# Patient Record
Sex: Male | Born: 1995 | Race: White | Hispanic: No | Marital: Single
Health system: Southern US, Community
[De-identification: ages and names within clinical notes are randomized; demographics above are authoritative.]

## PROBLEM LIST (undated history)

## (undated) DIAGNOSIS — K5900 Constipation, unspecified: Secondary | ICD-10-CM

## (undated) DIAGNOSIS — L729 Follicular cyst of the skin and subcutaneous tissue, unspecified: Secondary | ICD-10-CM

## (undated) DIAGNOSIS — T148XXA Other injury of unspecified body region, initial encounter: Secondary | ICD-10-CM

## (undated) DIAGNOSIS — T07XXXA Unspecified multiple injuries, initial encounter: Secondary | ICD-10-CM

## (undated) HISTORY — PX: CLOSED REDUCTION WRIST FRACTURE: SHX1091

## (undated) HISTORY — PX: SPINE SURGERY: SHX786

---
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
## 2021-08-25 ENCOUNTER — Other Ambulatory Visit (HOSPITAL_COMMUNITY): Payer: Self-pay | Admitting: Urology

## 2021-08-25 ENCOUNTER — Other Ambulatory Visit: Payer: Self-pay | Admitting: Urology

## 2021-09-29 ENCOUNTER — Other Ambulatory Visit (HOSPITAL_COMMUNITY): Payer: Self-pay | Admitting: Urology

## 2021-09-29 ENCOUNTER — Other Ambulatory Visit: Payer: Self-pay | Admitting: Urology

## 2021-09-29 DIAGNOSIS — M25561 Pain in right knee: Secondary | ICD-10-CM

## 2021-09-29 DIAGNOSIS — M545 Low back pain, unspecified: Secondary | ICD-10-CM

## 2021-10-02 ENCOUNTER — Ambulatory Visit (HOSPITAL_COMMUNITY): Admit: 2021-10-02 | Payer: No Typology Code available for payment source | Source: Ambulatory Visit

## 2021-10-02 ENCOUNTER — Encounter (HOSPITAL_COMMUNITY): Payer: Self-pay

## 2021-10-02 ENCOUNTER — Ambulatory Visit (HOSPITAL_COMMUNITY): Payer: No Typology Code available for payment source

## 2021-11-07 ENCOUNTER — Ambulatory Visit (HOSPITAL_COMMUNITY)
Admission: RE | Admit: 2021-11-07 | Discharge: 2021-11-07 | Disposition: A | Discharge status: Home / Self Care | Payer: No Typology Code available for payment source | Source: Ambulatory Visit | Attending: Urology | Admitting: Urology

## 2021-11-07 DIAGNOSIS — M545 Low back pain, unspecified: Secondary | ICD-10-CM | POA: Diagnosis present

## 2021-11-07 DIAGNOSIS — M25561 Pain in right knee: Secondary | ICD-10-CM | POA: Diagnosis present

## 2023-01-23 IMAGING — MR MR LUMBAR SPINE W/O CM
4 of 5 series · 27 of 48 positions shown · non-contrast
Comparison: None.

CLINICAL DATA: Low back pain

EXAM:
MRI LUMBAR SPINE WITHOUT CONTRAST
TECHNIQUE: Multiplanar, multisequence MR imaging of the lumbar spine was
performed. No intravenous contrast was administered.

[Series 9: T2 · sagittal · 4.0mm · 0.73mm/px · 6 of 15 slices shown (1 of 2)]
[im 1/15]
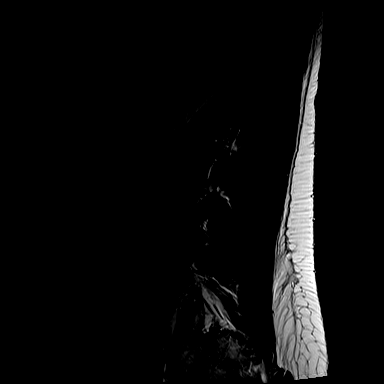
[im 3/15]
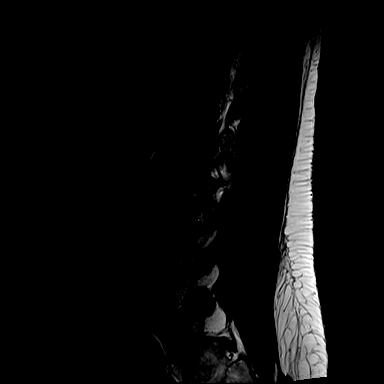
[im 6/15]
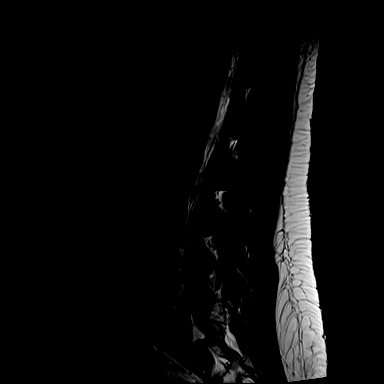
[im 9/15]
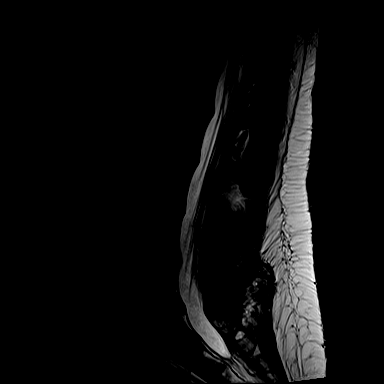
[im 12/15]
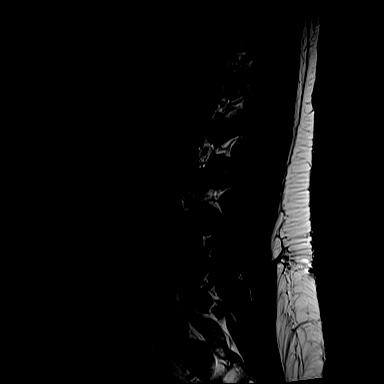
[im 15/15]
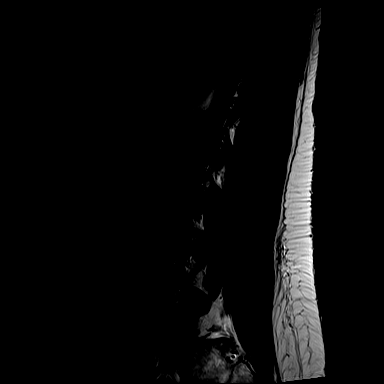

[Series 11: T1 · sagittal · 4.0mm · 0.88mm/px · 6 of 15 slices shown (1 of 2)]
[im 1/15]
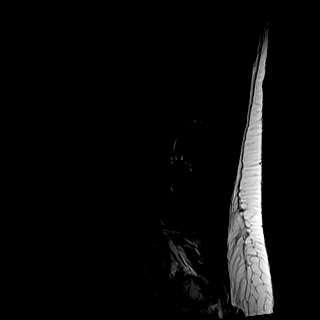
[im 3/15]
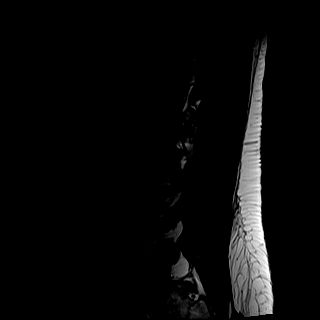
[im 6/15]
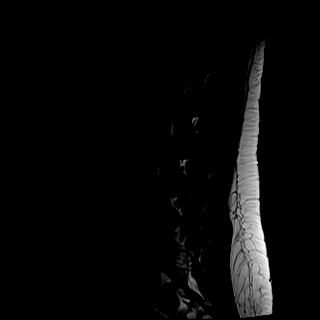
[im 9/15]
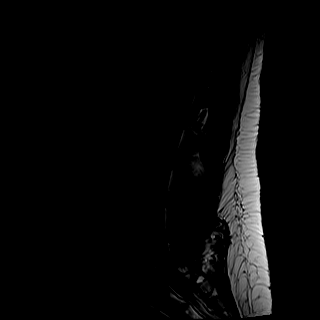
[im 12/15]
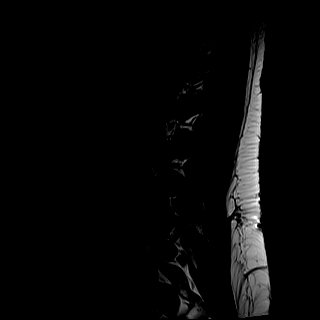
[im 15/15]
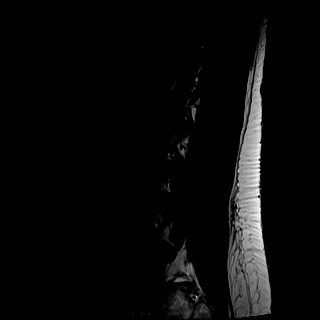

[Series 12: T2 · axial · 4.0mm · 0.57mm/px · z∈[+47,+267]mm · 9 of 37 slices shown (2 of 2)]
[im 1/37]
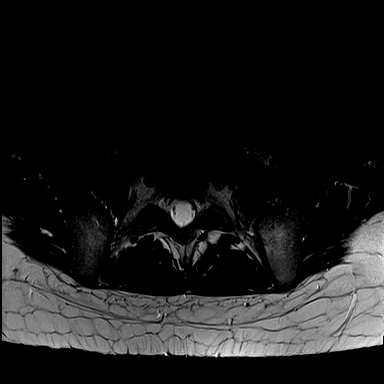
[im 6/37]
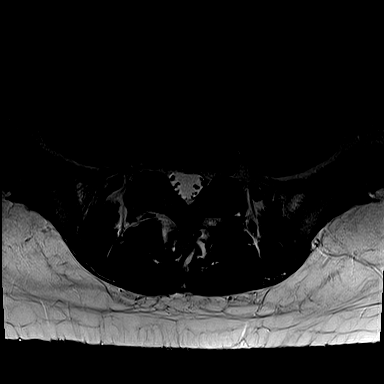
[im 11/37]
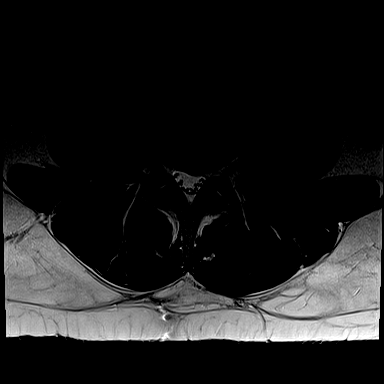
[im 16/37]
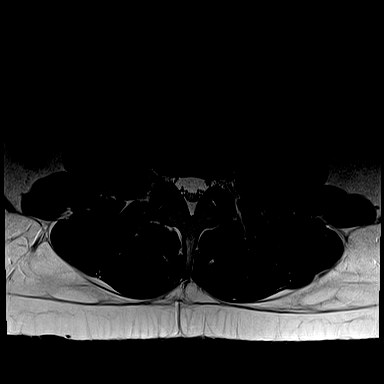
[im 19/37]
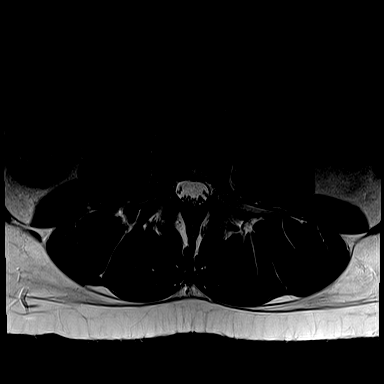
[im 21/37]
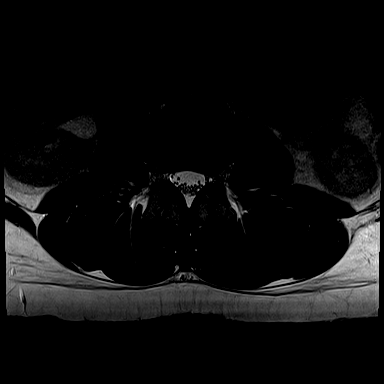
[im 26/37]
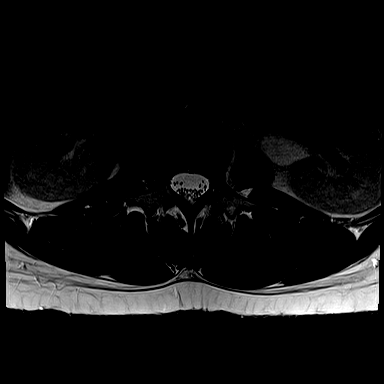
[im 31/37]
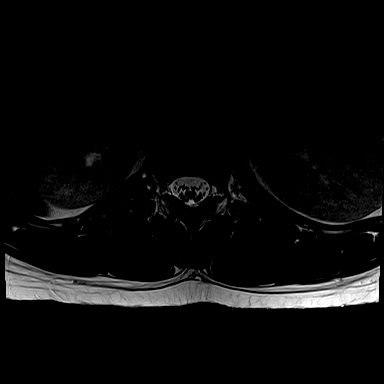
[im 37/37]
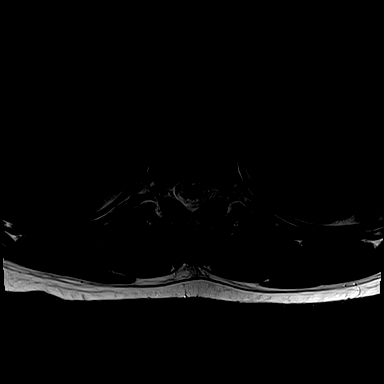

[Series 13: T1 · axial · 4.0mm · 0.34mm/px · z∈[+47,+238]mm · 6 of 37 slices shown (2 of 2)]
[im 1/37]
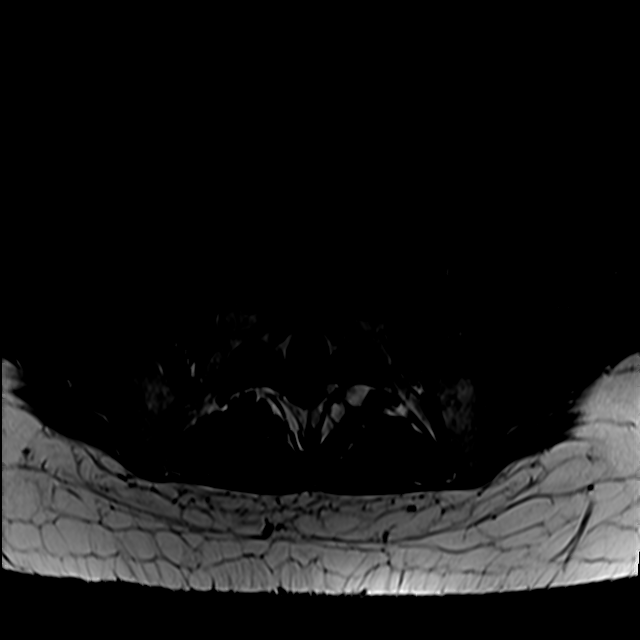
[im 6/37]
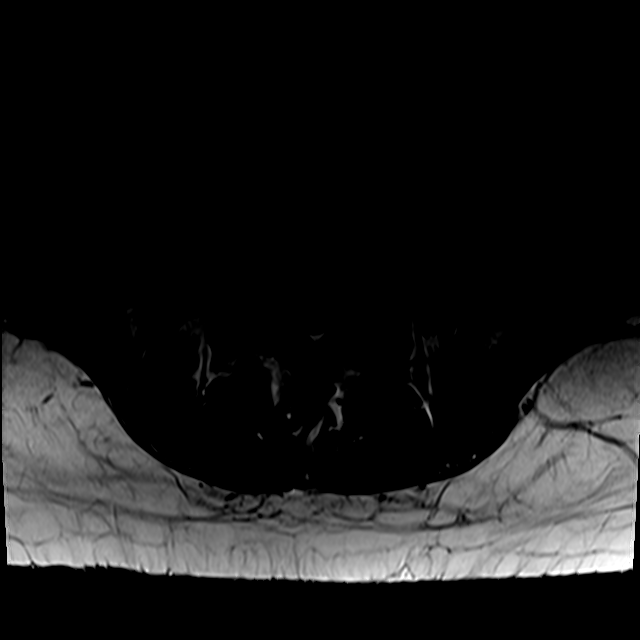
[im 11/37]
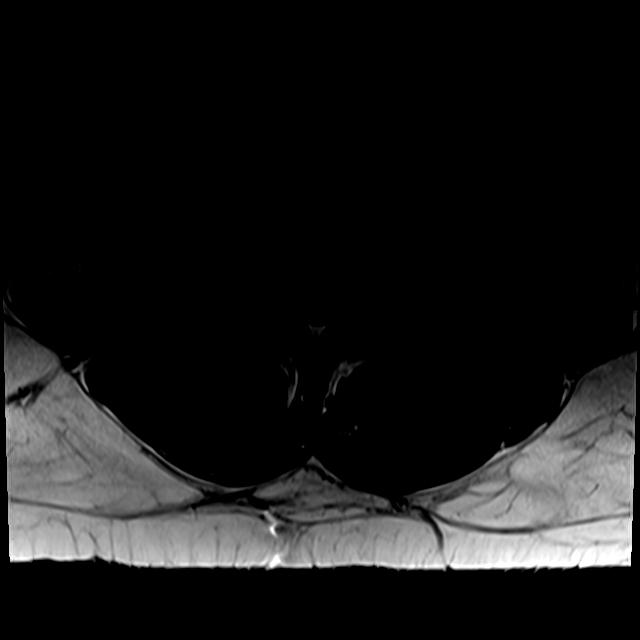
[im 16/37]
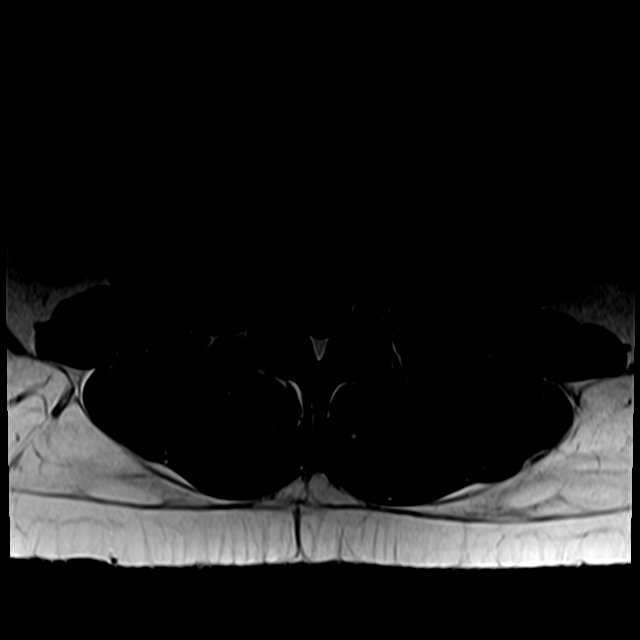
[im 19/37]
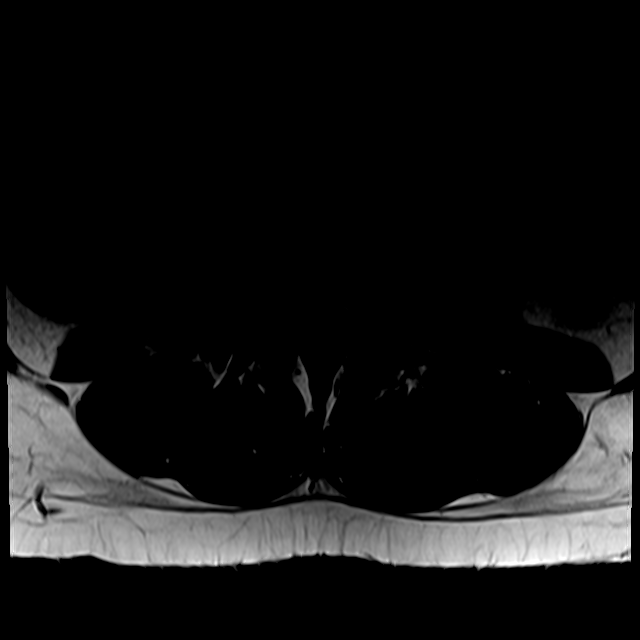
[im 31/37]
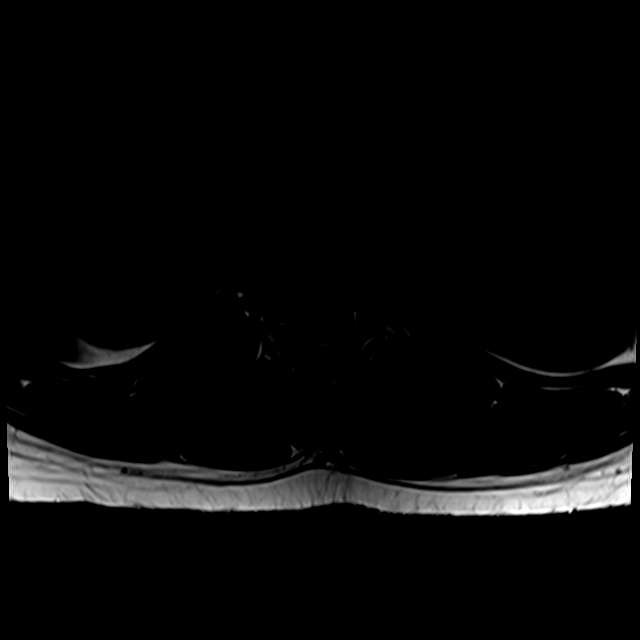

[27 of 48 positions shown; findings below may reference images not displayed]

FINDINGS: Segmentation:  Standard.

Alignment:  Physiologic.

Vertebrae:  No fracture, evidence of discitis, or bone lesion.

Conus medullaris and cauda equina: Conus extends to the L1-2 level.
Conus and cauda equina appear normal.

Paraspinal and other soft tissues: Negative for perispinal mass or
inflammation. Postoperative scarring in the posterior soft tissues.

Disc levels:

L4-5 mild disc desiccation and narrowing with left paracentral
herniation impinging on the descending left L5 nerve root.

L5-S1 mild disc desiccation and narrowing with bulge. Left
paracentral annular fissure. No neural compression
IMPRESSION: L4-5 left paracentral protrusion with left L5 impingement.

## 2023-07-23 ENCOUNTER — Emergency Department (HOSPITAL_COMMUNITY)
Admission: EM | Admit: 2023-07-23 | Discharge: 2023-07-23 | Disposition: A | Discharge status: Home / Self Care | Payer: No Typology Code available for payment source | Attending: Emergency Medicine | Admitting: Emergency Medicine

## 2023-07-23 ENCOUNTER — Encounter (HOSPITAL_COMMUNITY): Payer: Self-pay

## 2023-07-23 ENCOUNTER — Other Ambulatory Visit: Payer: Self-pay

## 2023-07-23 ENCOUNTER — Emergency Department (HOSPITAL_COMMUNITY): Payer: No Typology Code available for payment source

## 2023-07-23 DIAGNOSIS — Z20822 Contact with and (suspected) exposure to covid-19: Secondary | ICD-10-CM | POA: Insufficient documentation

## 2023-07-23 DIAGNOSIS — B974 Respiratory syncytial virus as the cause of diseases classified elsewhere: Secondary | ICD-10-CM | POA: Diagnosis not present

## 2023-07-23 DIAGNOSIS — R0602 Shortness of breath: Secondary | ICD-10-CM | POA: Insufficient documentation

## 2023-07-23 DIAGNOSIS — B338 Other specified viral diseases: Secondary | ICD-10-CM

## 2023-07-23 LAB — BASIC METABOLIC PANEL
Anion gap: 13 (ref 5–15)
BUN: 18 mg/dL (ref 6–20)
CO2: 21 mmol/L — ABNORMAL LOW (ref 22–32)
Calcium: 9.8 mg/dL (ref 8.9–10.3)
Chloride: 103 mmol/L (ref 98–111)
Creatinine, Ser: 0.99 mg/dL (ref 0.61–1.24)
GFR, Estimated: >60 mL/min (ref >60)
Glucose, Bld: 101 mg/dL — ABNORMAL HIGH (ref 70–99)
Potassium: 3.6 mmol/L (ref 3.5–5.1)
Sodium: 137 mmol/L (ref 135–145)

## 2023-07-23 LAB — CBC WITH DIFFERENTIAL/PLATELET
Abs Immature Granulocytes: 0.02 10*3/uL (ref 0.00–0.07)
Basophils Absolute: 0.1 10*3/uL (ref 0.0–0.1)
Basophils Relative: 1 %
Eosinophils Absolute: 0.3 10*3/uL (ref 0.0–0.5)
Eosinophils Relative: 3 %
HCT: 44.7 % (ref 39.0–52.0)
Hemoglobin: 14.8 g/dL (ref 13.0–17.0)
Immature Granulocytes: 0 %
Lymphocytes Relative: 27 %
Lymphs Abs: 2.1 10*3/uL (ref 0.7–4.0)
MCH: 29.3 pg (ref 26.0–34.0)
MCHC: 33.1 g/dL (ref 30.0–36.0)
MCV: 88.5 fL (ref 80.0–100.0)
Monocytes Absolute: 0.6 10*3/uL (ref 0.1–1.0)
Monocytes Relative: 7 %
Neutro Abs: 4.9 10*3/uL (ref 1.7–7.7)
Neutrophils Relative %: 62 %
Platelets: 247 10*3/uL (ref 150–400)
RBC: 5.05 MIL/uL (ref 4.22–5.81)
RDW: 12.8 % (ref 11.5–15.5)
WBC: 8 10*3/uL (ref 4.0–10.5)
nRBC: 0 % (ref 0.0–0.2)

## 2023-07-23 LAB — RESP PANEL BY RT-PCR (RSV, FLU A&B, COVID)  RVPGX2
Influenza A by PCR: NEGATIVE
Influenza B by PCR: NEGATIVE
Resp Syncytial Virus by PCR: POSITIVE — AB
SARS Coronavirus 2 by RT PCR: NEGATIVE

## 2023-07-23 LAB — D-DIMER, QUANTITATIVE: D-Dimer, Quant: 0.42 ug{FEU}/mL (ref 0.00–0.50)

## 2023-07-23 MED ORDER — PREDNISONE 20 MG PO TABS: ORAL_TABLET | ORAL | 0 refills | Status: DC

## 2023-07-23 MED ORDER — ALBUTEROL SULFATE HFA 108 (90 BASE) MCG/ACT IN AERS: 1.0000 | INHALATION_SPRAY | Freq: Four times a day (QID) | RESPIRATORY_TRACT | 0 refills | Status: DC | PRN

## 2023-07-23 MED ORDER — IPRATROPIUM-ALBUTEROL 0.5-2.5 (3) MG/3ML IN SOLN
3.0000 mL | Freq: Once | RESPIRATORY_TRACT | Status: AC
Start: 2023-07-23 — End: 2023-07-23
  Administered 2023-07-23: 3 mL via RESPIRATORY_TRACT
  Filled 2023-07-23: qty 3

## 2023-07-23 NOTE — ED Provider Triage Note (Signed)
Emergency Medicine Provider Triage Evaluation Note  ETHANMICHAEL FLORES , a 27 y.o. male  was evaluated in triage.  Pt complains of SOB. Report having some congestion 2 days ago, thought it may be a cold however now having SOB, chest tightness, pleuritic chest pain.  Dad has factor V leiden.    Review of Systems  Positive: As above Negative: As above  Physical Exam  BP (!) 138/97 (BP Location: Right Arm)   Pulse (!) 105   Temp (!) 97.4 F (36.3 C) (Oral)   Resp 20   Ht 6\' 2"  (1.88 m)   Wt 99.8 kg   SpO2 100%   BMI 28.25 kg/m  Gen:   Awake, no distress   Resp:  Normal effort  MSK:   Moves extremities without difficulty  Other:    Medical Decision Making  Medically screening exam initiated at 8:59 PM.  Appropriate orders placed.  MARKAVIOUS HETTINGER was informed that the remainder of the evaluation will be completed by another provider, this initial triage assessment does not replace that evaluation, and the importance of remaining in the ED until their evaluation is complete.     Fayrene Helper, PA-C 07/23/23 2100

## 2023-07-23 NOTE — ED Provider Notes (Signed)
[place] EM[facility]ENC[facility] DEPARTMENT AT [facility] Provider Note   CSN: [id number] Arrival date & time: 07/23/23  2040     History  Chief Complaint  Patient presents with   Shortness of Breath    Bill Little is a 27 y.o. male.  The history is provided by the patient and medical records.  Shortness of Breath  27 year old male presenting to the ED with shortness of breath.  States he started feeling unwell on Friday with cough and congestion, some yellow mucus present.  He denies any fever or chills.  States today he is began to feel more short of breath, diarrhea.  He states he feels like he is just not getting a full breath.  He has not noticed any wheezing..  Dad has history of factor V Leiden, however patient has never been diagnosed with this.  He has not noticed any leg swelling.  He has no history of DVT or PE.  No prior cardiac history.  No history of asthma or other respiratory issues.  Home Medications Prior to Admission medications   Medication Sig Start Date End Date Taking? Authorizing Provider  amoxicillin-clavulanate (AUGMENTIN) 500-125 MG per tablet Take 1 tablet (500 mg total) by mouth 2 (two) times daily. Patient not taking: Reported on 11/13/2014 07/16/14   Osborn Coho, MD  EPINEPHrine 0.3 mg/0.3 mL IJ SOAJ injection Inject 0.3 mLs (0.3 mg total) into the muscle once. 10/21/15   Melene Plan, DO  polyethylene glycol powder (MIRALAX) powder Take 255 g by mouth once. Patient not taking: Reported on 10/21/2015 11/13/14   Sharene Skeans, MD  predniSONE (DELTASONE) 20 MG tablet 2 tabs po daily x 4 days 10/21/15   Melene Plan, DO      Allergies    Morphine and codeine    Review of Systems   Review of Systems  Respiratory:  Positive for shortness of breath.   All other systems reviewed and are negative.   Physical Exam Updated Vital Signs BP (!) 138/97 (BP Location: Right Arm)   Pulse (!) 105   Temp (!) 97.4 F (36.3 C) (Oral)   Resp 20   Ht 6\' 2"   (1.88 m)   Wt 99.8 kg   SpO2 100%   BMI 28.25 kg/m   Physical Exam Vitals and nursing note reviewed.  Constitutional:      Appearance: He is well-developed.  HENT:     Head: Normocephalic and atraumatic.  Eyes:     Conjunctiva/sclera: Conjunctivae normal.     Pupils: Pupils are equal, round, and reactive to light.  Cardiovascular:     Rate and Rhythm: Normal rate and regular rhythm.     Heart sounds: Normal heart sounds.  Pulmonary:     Effort: Pulmonary effort is normal.     Breath sounds: Normal breath sounds. No wheezing or rhonchi.     Comments: Breath sounds are diminished but no audible wheezes or rhonchi, he is speaking in sentences without difficulty, dry cough on exam Abdominal:     General: Bowel sounds are normal.     Palpations: Abdomen is soft.  Musculoskeletal:        General: Normal range of motion.     Cervical back: Normal range of motion.  Skin:    General: Skin is warm and dry.  Neurological:     Mental Status: He is alert and oriented to person, place, and time.     ED Results / Procedures / Treatments   Labs (all labs  ordered are listed, but only abnormal results are displayed) Labs Reviewed  RESP PANEL BY RT-PCR (RSV, FLU A&B, COVID)  RVPGX2 - Abnormal; Notable for the following components:      Result Value   Resp Syncytial Virus by PCR POSITIVE (*)    All other components within normal limits  BASIC METABOLIC PANEL - Abnormal; Notable for the following components:   CO2 21 (*)    Glucose, Bld 101 (*)    All other components within normal limits  CBC WITH DIFFERENTIAL/PLATELET  D-DIMER, QUANTITATIVE    EKG None  Radiology DG Chest 2 View Result Date: 07/23/2023 CLINICAL DATA:  Cough, shortness of breath EXAM: CHEST - 2 VIEW COMPARISON:  None Available. FINDINGS: The heart size and mediastinal contours are within normal limits. Both lungs are clear. The visualized skeletal structures are unremarkable. IMPRESSION: Normal study.  Electronically Signed   By: Charlett Nose M.D.   On: 07/23/2023 21:17    Procedures Procedures    Medications Ordered in ED Medications  ipratropium-albuterol (DUONEB) 0.5-2.5 (3) MG/3ML nebulizer solution 3 mL (has no administration in time range)    ED Course/ Medical Decision Making/ A&P                                 Medical Decision Making Amount and/or Complexity of Data Reviewed Radiology: ordered and independent interpretation performed. ECG/medicine tests: ordered and independent interpretation performed.  Risk Prescription drug management.   33 male presenting to the ED with shortness of breath.  States he had cough and congestion a few days ago but now seems to have difficulty breathing.  States he feels like he is not getting a full breath.  Has a little bit of a dry cough.  No hemoptysis.  Dad has history of factor V Leiden.  He is afebrile and nontoxic in appearance here.  He has a dry cough on exam but no wheezes or rhonchi.  Breath sounds are little diminished.  He was given DuoNeb here.  Labs as above--no leukocytosis or electrolyte derangement.  D-dimer sent from triage and is negative.  RVP is positive for RSV.  Chest x-ray without acute findings.  Suspect symptoms likely related to his RSV.  Feel he is stable for discharge with symptomatic management, will start trial of prednisone and albuterol inhaler.  Work note given.  Can return here for new concerns.  Final Clinical Impression(s) / ED Diagnoses Final diagnoses:  RSV (respiratory syncytial virus infection)    Rx / DC Orders ED Discharge Orders          Ordered    predniSONE (DELTASONE) 20 MG tablet        07/23/23 2342    albuterol (VENTOLIN HFA) 108 (90 Base) MCG/ACT inhaler  Every 6 hours PRN        07/23/23 2342              Garlon Hatchet, PA-C 07/23/23 2344    Rolan Bucco, MD 07/24/23 [phone]

## 2023-07-23 NOTE — Discharge Instructions (Signed)
You were found to have RSV today.  This is infectious so use caution when around others. Take the prescribed medication as directed. Make sure to rest and stay hydrated. Follow-up with your primary care doctor. Return to the ED for new or worsening symptoms.

## 2023-07-23 NOTE — ED Triage Notes (Signed)
Pt started with cold symptoms on Friday, now he is short of breath and feels real weak with standing.

## 2023-11-25 ENCOUNTER — Encounter (HOSPITAL_COMMUNITY): Payer: Self-pay

## 2023-11-25 ENCOUNTER — Ambulatory Visit (INDEPENDENT_AMBULATORY_CARE_PROVIDER_SITE_OTHER)

## 2023-11-25 ENCOUNTER — Ambulatory Visit (HOSPITAL_COMMUNITY)
Admission: EM | Admit: 2023-11-25 | Discharge: 2023-11-25 | Disposition: A | Discharge status: Home / Self Care | Attending: Emergency Medicine | Admitting: Emergency Medicine

## 2023-11-25 DIAGNOSIS — M545 Low back pain, unspecified: Secondary | ICD-10-CM

## 2023-11-25 MED ORDER — KETOROLAC TROMETHAMINE 30 MG/ML IJ SOLN
INTRAMUSCULAR | Status: AC
  Filled 2023-11-25: qty 1

## 2023-11-25 MED ORDER — MELOXICAM 15 MG PO TABS: 15.0000 mg | ORAL_TABLET | Freq: Every day | ORAL | 0 refills | Status: AC

## 2023-11-25 MED ORDER — KETOROLAC TROMETHAMINE 30 MG/ML IJ SOLN
30.0000 mg | Freq: Once | INTRAMUSCULAR | Status: AC
  Administered 2023-11-25: 30 mg via INTRAMUSCULAR

## 2023-11-25 MED ORDER — PREDNISONE 20 MG PO TABS
40.0000 mg | ORAL_TABLET | Freq: Every day | ORAL | 0 refills | Status: AC
Start: 2023-11-25 — End: 2023-11-30

## 2023-11-25 NOTE — ED Triage Notes (Signed)
 Patient presenting with lower back pain right above the tailbone onset 3 days ago. No known falls, injuries, or strains. Has had 2 back surgeries on the L4-L5 last done in 2023. Had to slide out of bed and crawl to the bathroom for the last 2 days.  Prescriptions or OTC medications tried: No

## 2023-11-25 NOTE — ED Provider Notes (Signed)
 [facility]    CSN: [id number] Arrival date & time: 11/25/23  1525      History   Chief Complaint Chief Complaint  Patient presents with   Back Pain    HPI Bill Little is a 28 y.o. male.   Patient presents with midline low back pain x 3 days ago.  Patient states that the last 2 days the pain has been so severe he has been having to slide out of the bed and crawl to the bathroom.  Patient is ambulatory in urgent care but endorses 7 out of 10 pain.  Denies numbness, tingling, weakness, saddle paresthesia, bowel/bladder incontinence, and any recent falls or trauma.  Patient has history of 2 microdiscectomy procedures.  Patient states the most recent procedure was in May 2023 to L4-L5 region.  Patient states in the past nothing seems to help with the pain until he has surgery.  Patient denies taking anything for pain.  [facility].  The history is provided by the patient and medical records.  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 Authorizing Provider  meloxicam (MOBIC) 15 MG tablet Take 1 tablet (15 mg total) by mouth daily. 11/25/23  Yes Rosevelt Constable, Kmari Halter A, NP  predniSONE  (DELTASONE ) 20 MG tablet Take 2 tablets (40 mg total) by mouth daily for 5 days. 11/25/23 11/30/23 Yes  Tishara Pizano A, NP  EPINEPHrine  0.3 mg/0.3 mL IJ SOAJ injection Inject 0.3 mLs (0.3 mg total) into the muscle once. 10/21/15   Albertus Hughs, DO    Family History Family History  Problem Relation Age of Onset   Hypertension Mother    Hypertension Father    Factor V Leiden deficiency Father    Diabetes Paternal Grandmother     Social History Social History   Tobacco Use   Smoking status: Passive Smoke Exposure - Never Smoker   Smokeless tobacco: Never   Tobacco comments:    e-cigarette smokers inside at home  Substance Use Topics   Alcohol use: No   Drug use: No     Allergies   Morphine  and codeine   Review of Systems Review of Systems  Musculoskeletal:  Positive for back pain.   Per HPI  Physical Exam Triage Vital Signs ED Triage Vitals  Encounter Vitals Group     BP 11/25/23 1625 125/81     Systolic BP Percentile --      Diastolic BP Percentile --      Pulse Rate 11/25/23 1625 (!) 57     Resp 11/25/23 1625 18     Temp 11/25/23 1625 (!) 97.5 F (36.4 C)     Temp Source 11/25/23 1625 Oral  SpO2 11/25/23 1625 98 %     Weight --      Height --      Head Circumference --      Peak Flow --      Pain Score 11/25/23 1623 7     Pain Loc --      Pain Education --      Exclude from Growth Chart --    No data found.  Updated Vital Signs BP 125/81 (BP Location: Left Arm)   Pulse (!) 57   Temp (!) 97.5 F (36.4 C) (Oral)   Resp 18   SpO2 98%   Visual Acuity Right Eye Distance:   Left Eye Distance:   Bilateral Distance:    Right Eye Near:   Left Eye Near:    Bilateral Near:     Physical Exam Vitals and nursing note reviewed.  Constitutional:      General: He is awake. He is not in acute distress.    Appearance: Normal appearance. He is well-developed and well-groomed. He is not ill-appearing.  Musculoskeletal:     Cervical back: Normal.     Thoracic back: Normal.     Lumbar back: No swelling, deformity, signs of trauma, spasms, tenderness or  bony tenderness. Normal range of motion. Negative right straight leg raise test and negative left straight leg raise test.  Skin:    General: Skin is warm and dry.  Neurological:     Mental Status: He is alert and oriented to person, place, and time. Mental status is at baseline.     GCS: GCS eye subscore is 4. GCS verbal subscore is 5. GCS motor subscore is 6.     Cranial Nerves: Cranial nerves 2-12 are intact.     Sensory: Sensation is intact.     Motor: Motor function is intact.     Coordination: Coordination is intact.     Gait: Gait is intact.  Psychiatric:        Behavior: Behavior is cooperative.      UC Treatments / Results  Labs (all labs ordered are listed, but only abnormal results are displayed) Labs Reviewed - No data to display  EKG   Radiology No results found.  Procedures Procedures (including critical care time)  Medications Ordered in UC Medications  ketorolac (TORADOL) 30 MG/ML injection 30 mg (30 mg Intramuscular Given 11/25/23 1721)    Initial Impression / Assessment and Plan / UC Course  I have reviewed the triage vital signs and the nursing notes.  Pertinent labs & imaging results that were available during my care of the patient were reviewed by me and considered in my medical decision making (see chart for details).     Patient is well-appearing.  Vitals are stable.  Upon assessment patient does not have any tenderness, but does report increased pain with movement and walking.  No neurodeficits noted.  Sensation, motor function, coordination, and gait intact.  No red flag symptoms reported.  X-ray ordered.  Based on my interpretation of x-ray there is no acute injury noted.  Given IM Toradol in clinic.  Prescribed prednisone  and Mobic to assist with pain.  [facility].  Discussed return and strict ER precautions. Final Clinical Impressions(s) / UC Diagnoses   Final diagnoses:  Acute midline  low back pain without sciatica     Discharge Instructions      Based on my interpretation of your x-ray I do not see any  obvious fractures or trauma.  If radiology report reads differently you will receive a phone call advising you of adjustment in treatment plan.   Take 2 tablets of prednisone  twice daily for 5 days to help with inflammation causing her pain. Take 1 tablet of meloxicam daily starting tomorrow.  Do not take this with other NSAIDs including ibuprofen, naproxen, Advil, Aleve, Motrin, and Goody powder. You can take 650 mg of Tylenol  every 4-6 hours as needed for pain. Apply ice and heat as needed for pain.  And do some gentle stretching. [facility] so [facility] can [facility] you out to neurology for further evaluation in the event that you need another surgery.     ED Prescriptions     Medication Sig Dispense Auth. Provider   predniSONE  (DELTASONE ) 20 MG tablet Take 2 tablets (40 mg total) by mouth daily for 5 days. 10 tablet Levora Reas A, NP   meloxicam (MOBIC) 15 MG tablet Take 1 tablet (15 mg total) by mouth daily. 30 tablet Levora Reas A, NP      PDMP not reviewed this encounter.

## 2023-11-25 NOTE — Discharge Instructions (Signed)
 Based on my interpretation of your x-ray I do not see any obvious fractures or trauma.  If radiology report reads differently you will receive a phone call advising you of adjustment in treatment plan.   Take 2 tablets of prednisone  twice daily for 5 days to help with inflammation causing her pain. Take 1 tablet of meloxicam daily starting tomorrow.  Do not take this with other NSAIDs including ibuprofen, naproxen, Advil, Aleve, Motrin, and Goody powder. You can take 650 mg of Tylenol  every 4-6 hours as needed for pain. Apply ice and heat as needed for pain.  And do some gentle stretching. [facility] so [facility] can [facility] you out to neurology for further evaluation in the event that you need another surgery.
